# Patient Record
Sex: Female | Born: 1945 | Race: White | Hispanic: No | Marital: Married | Smoking: Never smoker
Health system: Southern US, Community
[De-identification: ages and names within clinical notes are randomized; demographics above are authoritative.]

## PROBLEM LIST (undated history)

## (undated) DIAGNOSIS — E279 Disorder of adrenal gland, unspecified: Secondary | ICD-10-CM

## (undated) DIAGNOSIS — R030 Elevated blood-pressure reading, without diagnosis of hypertension: Secondary | ICD-10-CM

## (undated) DIAGNOSIS — I998 Other disorder of circulatory system: Secondary | ICD-10-CM

## (undated) DIAGNOSIS — R3121 Asymptomatic microscopic hematuria: Secondary | ICD-10-CM

## (undated) HISTORY — DX: Asymptomatic microscopic hematuria: R31.21

## (undated) HISTORY — DX: Other disorder of circulatory system: I99.8

## (undated) HISTORY — DX: Elevated blood-pressure reading, without diagnosis of hypertension: R03.0

## (undated) HISTORY — DX: Disorder of adrenal gland, unspecified: E27.9

---

## 1980-08-28 HISTORY — PX: APPENDECTOMY: SHX54

## 2009-05-28 HISTORY — PX: NISSEN FUNDOPLICATION: SHX2091

## 2018-09-04 ENCOUNTER — Other Ambulatory Visit: Payer: Self-pay | Admitting: Internal Medicine

## 2018-09-04 DIAGNOSIS — Z1231 Encounter for screening mammogram for malignant neoplasm of breast: Secondary | ICD-10-CM

## 2018-09-04 DIAGNOSIS — R030 Elevated blood-pressure reading, without diagnosis of hypertension: Secondary | ICD-10-CM

## 2018-09-04 DIAGNOSIS — I998 Other disorder of circulatory system: Secondary | ICD-10-CM

## 2018-09-04 HISTORY — DX: Elevated blood-pressure reading, without diagnosis of hypertension: R03.0

## 2018-09-04 HISTORY — DX: Other disorder of circulatory system: I99.8

## 2018-10-24 ENCOUNTER — Other Ambulatory Visit (HOSPITAL_COMMUNITY): Payer: Self-pay | Admitting: Internal Medicine

## 2018-10-24 ENCOUNTER — Other Ambulatory Visit: Payer: Self-pay | Admitting: Internal Medicine

## 2018-10-24 DIAGNOSIS — R3121 Asymptomatic microscopic hematuria: Secondary | ICD-10-CM

## 2018-10-30 ENCOUNTER — Ambulatory Visit
Admission: RE | Admit: 2018-10-30 | Discharge: 2018-10-30 | Disposition: A | Discharge status: Home / Self Care | Payer: Medicare Other | Source: Ambulatory Visit | Attending: Internal Medicine | Admitting: Internal Medicine

## 2018-10-30 ENCOUNTER — Other Ambulatory Visit: Payer: Self-pay

## 2018-10-30 DIAGNOSIS — R3121 Asymptomatic microscopic hematuria: Secondary | ICD-10-CM | POA: Insufficient documentation

## 2018-10-30 HISTORY — DX: Asymptomatic microscopic hematuria: R31.21

## 2018-10-30 LAB — POCT I-STAT CREATININE: Creatinine, Ser: 0.7 mg/dL (ref 0.44–1.00)

## 2018-10-30 MED ORDER — IOHEXOL 300 MG/ML  SOLN
125.0000 mL | Freq: Once | INTRAMUSCULAR | Status: AC | PRN
  Administered 2018-10-30: 125 mL via INTRAVENOUS

## 2018-11-18 ENCOUNTER — Ambulatory Visit: Payer: Self-pay | Admitting: Urology

## 2018-11-18 ENCOUNTER — Encounter

## 2018-11-21 ENCOUNTER — Telehealth: Payer: Self-pay | Admitting: Urology

## 2018-11-21 NOTE — Telephone Encounter (Signed)
-----   Message from Abbie Sons, MD sent at 11/21/2018  7:53 AM EDT ----- Regarding: Appointment Patient is scheduled to see me on 4/20.  She has an obstructing stone with hydronephrosis.  Please reschedule to 1 of my Tuesdays within the next 1 to 2 weeks.

## 2018-11-21 NOTE — Telephone Encounter (Signed)
LM FOR PT TO CB TO CONFIRM

## 2018-11-26 ENCOUNTER — Ambulatory Visit: Payer: Self-pay | Admitting: Urology

## 2018-11-28 DIAGNOSIS — E279 Disorder of adrenal gland, unspecified: Secondary | ICD-10-CM

## 2018-11-28 DIAGNOSIS — E278 Other specified disorders of adrenal gland: Secondary | ICD-10-CM

## 2018-11-28 HISTORY — DX: Other specified disorders of adrenal gland: E27.8

## 2018-12-03 ENCOUNTER — Ambulatory Visit: Payer: Self-pay | Admitting: Urology

## 2018-12-11 ENCOUNTER — Other Ambulatory Visit: Payer: Self-pay

## 2018-12-11 ENCOUNTER — Ambulatory Visit: Payer: Medicare Other | Admitting: Urology

## 2018-12-11 ENCOUNTER — Encounter: Payer: Self-pay | Admitting: Urology

## 2018-12-11 VITALS — Ht 64.0 in | Wt 176.0 lb

## 2018-12-11 DIAGNOSIS — N200 Calculus of kidney: Secondary | ICD-10-CM

## 2018-12-11 DIAGNOSIS — D179 Benign lipomatous neoplasm, unspecified: Secondary | ICD-10-CM | POA: Diagnosis not present

## 2018-12-11 DIAGNOSIS — D3502 Benign neoplasm of left adrenal gland: Secondary | ICD-10-CM | POA: Diagnosis not present

## 2018-12-11 LAB — URINALYSIS, COMPLETE
Bilirubin, UA: NEGATIVE
Glucose, UA: NEGATIVE
Ketones, UA: NEGATIVE
Leukocytes,UA: NEGATIVE
Nitrite, UA: NEGATIVE
Protein,UA: NEGATIVE
Specific Gravity, UA: >1.03 — ABNORMAL HIGH (ref 1.005–1.030)
Urobilinogen, Ur: 0.2 mg/dL (ref 0.2–1.0)
pH, UA: 5.5 (ref 5.0–7.5)

## 2018-12-11 LAB — MICROSCOPIC EXAMINATION
Bacteria, UA: NONE SEEN
RBC, Urine: NONE SEEN /hpf (ref 0–2)
WBC, UA: NONE SEEN /hpf (ref 0–5)

## 2018-12-11 NOTE — Patient Instructions (Signed)
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

## 2018-12-11 NOTE — Progress Notes (Signed)
12/11/2018 5:06 PM   Jasmin Walker 01/10/1946 [id number]  Referring provider: Glendon Axe, MD [facility] [employer] [place], [place] [address]  Chief Complaint  Patient presents with  . Nephrolithiasis    New patient    HPI: 73 year old female referred for further evaluation of kidney stones and angiomyolipoma.  She started experiencing right flank pain along with urinary symptoms including urgency, frequency, sensation of difficulty urinating along with microscopic hematuria.  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  She saw stone pass 2 weeks ago ago which she was unablet o pass.  She describes it as a barbell shaped.  She drew out the size and shape of the stone which she estimated which is consistent with the UVJ stone on CT scan.  She last had pain 1.5 weeks ago which was more dull.  Subsequently, her urinary symptoms have started to completely resolve and she has no further pain or discomfort.  She has had never previously had stones.    Discharge drink a lot of water including lemon.  She avoids salt and primarily eats at home, non-processed foods.  She has been referred to endocrinology for further evaluation of the incidental adrenal adenoma.  PMH: Past Medical History:  Diagnosis Date  . Adrenal nodule ([place]) 11/28/2018  . Asymptomatic microscopic hematuria 10/30/2018  . Fluctuating blood pressure 09/04/2018  . White coat syndrome without diagnosis of hypertension 09/04/2018    Surgical History: Past Surgical History:  Procedure Laterality Date  . APPENDECTOMY  1982  . NISSEN FUNDOPLICATION  16/0109    Home Medications:  Allergies as of 12/11/2018   No Known Allergies     Medication List       Accurate as of December 11, 2018   5:06 PM. Always use your most recent med list.        omeprazole 10 MG capsule Commonly known as:  PRILOSEC Take by mouth.   tamsulosin 0.4 MG Caps capsule Commonly known as:  FLOMAX Take by mouth.       Allergies: No Known Allergies  Family History: Family History  Problem Relation Age of Onset  . Cancer Maternal Grandmother        Stomach    Social History:  reports that she has never smoked. She has never used smokeless tobacco. She reports current alcohol use. She reports that she does not use drugs.  ROS: UROLOGY Frequent Urination?: No Hard to postpone urination?: No Burning/pain with urination?: No Get up at night to urinate?: Yes Leakage of urine?: No Urine stream starts and stops?: Yes Trouble starting stream?: No Do you have to strain to urinate?: No Blood in urine?: No Urinary tract infection?: No Sexually transmitted disease?: No Injury to kidneys or bladder?: No Painful intercourse?: No Weak stream?: No Currently pregnant?: No Vaginal bleeding?: No Last menstrual period?: n  Gastrointestinal Nausea?: No Vomiting?: No Indigestion/heartburn?: No Diarrhea?: No Constipation?: No  Constitutional Fever: No Night sweats?: No Weight loss?: No Fatigue?: No  Skin Skin rash/lesions?: No Itching?: No  Eyes Blurred vision?: No Double vision?: No  Ears/Nose/Throat Sore throat?: No Sinus problems?: No  Hematologic/Lymphatic Swollen glands?: No Easy bruising?: No  Cardiovascular Leg swelling?: No Chest pain?: No  Respiratory Cough?: No Shortness of breath?: No  Endocrine Excessive thirst?: No  Musculoskeletal Back  pain?: No Joint pain?: No  Neurological Headaches?: No Dizziness?: No  Psychologic Depression?: No Anxiety?: No  Physical Exam: Ht 5\' 4"  (1.626 m)   Wt 176 lb (79.8 kg)   BMI 30.21 kg/m   Constitutional:  Alert and oriented, No acute distress. HEENT: [place] AT, moist mucus membranes.  Trachea midline, no  masses. Cardiovascular: No clubbing, cyanosis, or edema. Respiratory: Normal respiratory effort, no increased work of breathing. Skin: No rashes, bruises or suspicious lesions. Neurologic: Grossly intact, no focal deficits, moving all 4 extremities. Psychiatric: Normal mood and affect.  Laboratory Data: Lab Results  Component Value Date   CREATININE 0.70 10/30/2018    Urinalysis Results for orders placed or performed in visit on 12/11/18  Microscopic Examination  Result Value Ref Range   WBC, UA None seen 0 - 5 /hpf   RBC None seen 0 - 2 /hpf   Epithelial Cells (non renal) 0-10 0 - 10 /hpf   Bacteria, UA None seen None seen/Few  Urinalysis, Complete  Result Value Ref Range   Specific Gravity, UA >1.030 (H) 1.005 - 1.030   pH, UA 5.5 5.0 - 7.5   Color, UA Yellow Yellow   Appearance Ur Cloudy (A) Clear   Leukocytes,UA Negative Negative   Protein,UA Negative Negative/Trace   Glucose, UA Negative Negative   Ketones, UA Negative Negative   RBC, UA Trace (A) Negative   Bilirubin, UA Negative Negative   Urobilinogen, Ur 0.2 0.2 - 1.0 mg/dL   Nitrite, UA Negative Negative   Microscopic Examination See below:     Pertinent Imaging: CLINICAL DATA:  Right flank pain over the last 2 weeks with polyuria. Microscopic hematuria.  EXAM: CT ABDOMEN AND PELVIS WITHOUT AND WITH CONTRAST  TECHNIQUE: Multidetector CT imaging of the abdomen and pelvis was performed following the standard protocol before and following the bolus administration of intravenous contrast.  CONTRAST:  161mL OMNIPAQUE IOHEXOL 300 MG/ML  SOLN  COMPARISON:  None.  FINDINGS: Lower chest: A left lower lobe subpleural nodule measures 1.3 by 1.2 by 1.4 cm.  Left anterior descending and circumflex coronary artery atherosclerotic calcification. Descending thoracic aortic atherosclerosis.  Hiatal hernia noted containing a partially un wrapped fundoplication.  Hepatobiliary: 1.2 by 1.0 cm hypodense  lesion in the caudate lobe is likely a cyst. Gallbladder unremarkable.  Pancreas: Benign-appearing fatty lesion junction of the pancreatic body and tail measuring 1.2 by 0.8 by 0.9 cm, favoring a lipoma.  Spleen: Unremarkable  Adrenals/Urinary Tract: 2.4 by 1.9 cm left adrenal mass noted with absolute washout of 56% and a relative washout of 46%, consistent with adenoma. Slight nodularity of the right adrenal gland without discrete right adrenal mass.  2.9 by 2.3 by 2.5 cm primarily fatty mass of the right kidney upper pole compatible with angiomyolipoma, with small internal enhancing elements.  1.6 cm nonenhancing cystic lesion of the right mid kidney on image 38/9, compatible with benign and likely simple cyst.  2 hypodense lesions of the left kidney upper pole is technically nonspecific due to small size.  There is mild right hydronephrosis as well as mild right hydroureter extending down to a right UVJ calculus or cluster of calculi measuring 0.9 by 0.4 by 0.4 cm. Accentuated right ureteral enhancement and mild wall thickening of the bladder in the immediate vicinity of the right UVJ, probably due to local inflammation.  Suspected punctate 1 mm right kidney lower pole nonobstructive renal calculus. There are about 7 punctate nonobstructive left renal calculi, the largest 3 mm in long  axis in the left mid kidney on image 72/5. No left hydronephrosis or left hydroureter.  Stomach/Bowel: As noted above cysts, a partially unwrapped fundoplication extends up into a small hiatal hernia.  Vascular/Lymphatic: Aortoiliac atherosclerotic vascular disease.  Reproductive: Unremarkable  Other: No supplemental non-categorized findings.  Musculoskeletal: Suspected small bilateral indirect inguinal hernias containing adipose tissue. Lower thoracic spondylosis. Mild lumbar degenerative disc disease. Right foraminal impingement at L5-S1 due to facet arthropathy and right  foraminal disc protrusion.  IMPRESSION: 1. Cluster of right UVJ calculi measuring 0.9 by 0.4 by 0.4 cm, with adjacent wall thickening of the distal right ureter and of the urinary bladder in the immediate vicinity of the UVJ likely due to local inflammation. Associated mild right hydronephrosis and hydroureter. 2. Bilateral additional nonobstructive renal calculi. 3. 2.4 cm left adrenal adenoma. 4. 2.9 cm right kidney upper pole angiomyolipoma. 5. Partially un wrapped fundoplication extends up into a small hiatal hernia. 6.  Aortic Atherosclerosis (ICD10-I70.0).  Coronary atherosclerosis. 7. Right foraminal impingement at L5-S1. 8. Small pancreatic lipoma.   Electronically Signed   By: Van Clines M.D.   On: 10/30/2018 16:46   Assessment & Plan:    1. Kidney stones Left UVJ stone, subsequently passed Given the patient's description of the stone which matches the CT scan exactly and her resolution of pain and clear urine today, I am certain that she has passed the stone She does have a punctate stone, nonobstructing on the left but otherwise a stone free For further imaging or intervention at this time We discussed general stone prevention techniques including drinking plenty water with goal of producing 2.5 L urine daily, increased citric acid intake, avoidance of high oxalate containing foods, and decreased salt intake.  Information about dietary recommendations given today.   - Urinalysis, Complete  2. 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 Adenoma of left adrenal gland Being evaluated by endocrinology  Return in about 2  years (around 12/10/2020) for RUS.  Hollice Espy, MD  [facility] [address], [place], [place] [address] [phone]

## 2018-12-16 ENCOUNTER — Ambulatory Visit: Payer: Self-pay | Admitting: Urology

## 2018-12-17 ENCOUNTER — Ambulatory Visit: Payer: Self-pay | Admitting: Urology

## 2019-01-29 ENCOUNTER — Ambulatory Visit: Payer: Self-pay | Admitting: Urology

## 2019-05-21 ENCOUNTER — Other Ambulatory Visit: Payer: Self-pay | Admitting: Physician Assistant

## 2019-05-21 DIAGNOSIS — R1032 Left lower quadrant pain: Secondary | ICD-10-CM

## 2019-05-22 ENCOUNTER — Encounter (INDEPENDENT_AMBULATORY_CARE_PROVIDER_SITE_OTHER): Payer: Self-pay

## 2019-05-22 ENCOUNTER — Other Ambulatory Visit: Payer: Self-pay

## 2019-05-22 ENCOUNTER — Ambulatory Visit
Admission: RE | Admit: 2019-05-22 | Discharge: 2019-05-22 | Disposition: A | Discharge status: Home / Self Care | Payer: Medicare Other | Source: Ambulatory Visit | Attending: Physician Assistant | Admitting: Physician Assistant

## 2019-05-22 DIAGNOSIS — R1032 Left lower quadrant pain: Secondary | ICD-10-CM | POA: Diagnosis present

## 2019-05-22 MED ORDER — IOHEXOL 300 MG/ML  SOLN
100.0000 mL | Freq: Once | INTRAMUSCULAR | Status: AC | PRN
  Administered 2019-05-22: 100 mL via INTRAVENOUS

## 2019-05-26 NOTE — Progress Notes (Signed)
05/27/2019 3:36 PM   Darriel Schall [place] 08/12/1946 [id number]  Referring provider: Glendon Axe, MD No address on file  Chief Complaint  Patient presents with  . Other    HPI: 73 year old female referred again for further evaluation of kidney stones.    CT abdomen pelvis with and without contrast on 10/30/2018 which showed a 9 mm presumed cluster of right UVJ stones with proximal hydroureteronephrosis along with a incidental adrenal adenoma and 2.9 cm right upper pole angiomyolipoma.  Endocrinology following incidental adrenal adenoma.    Spontaneously passed the right UVJ stones.  Recent CT abdomen pelvis with contrast on 05/22/2019 revealed moderate left hydronephrosis due to a punctate stone in the left ureter at approximately the level of L4.  Mild fullness of the right intrarenal collecting system is improved compared to the prior CT. Cluster of stones at the right UVJ seen on the prior study is no longer present.  Single punctate nonobstructing renal stones.  2.6 cm angiomyolipoma in the upper pole the right kidney, unchanged.  Unchanged left adrenal adenoma.  She states that she had the sudden onset of left flank pain that radiated to her left waist about two weeks ago.  When the pain first occurred, it caused severe nausea.  It has been of less intensity over the last two weeks.  It is colicky in nature and similar to the other stones she has had in the past.  She has not seen passage of a fragment or had gross hematuria.  Her UA today is negative.  She has been asymptomatic over the last two weeks.    PMH: Past Medical History:  Diagnosis Date  . Adrenal nodule ([place]) 11/28/2018  . Asymptomatic microscopic hematuria 10/30/2018  . Fluctuating blood pressure 09/04/2018  . White coat syndrome without diagnosis of hypertension 09/04/2018    Surgical History: Past Surgical History:  Procedure Laterality Date  . APPENDECTOMY  1982  . NISSEN FUNDOPLICATION  [id number]    Home  Medications:  Allergies as of 05/27/2019   No Known Allergies     Medication List       Accurate as of May 27, 2019  3:36 PM. If you have any questions, ask your nurse or doctor.        omeprazole 10 MG capsule Commonly known as: PRILOSEC Take by mouth.   tamsulosin 0.4 MG Caps capsule Commonly known as: FLOMAX Take by mouth.       Allergies: No Known Allergies  Family History: Family History  Problem Relation Age of Onset  . Cancer Maternal Grandmother        Stomach    Social History:  reports that she has never smoked. She has never used smokeless tobacco. She reports current alcohol use. She reports that she does not use drugs.  ROS: UROLOGY Frequent Urination?: No Hard to postpone urination?: Yes Burning/pain with urination?: No Get up at night to urinate?: No Leakage of urine?: No Urine stream starts and stops?: Yes Trouble starting stream?: No Do you have to strain to urinate?: No Blood in urine?: No Urinary tract infection?: No Sexually transmitted disease?: No Injury to kidneys or bladder?: No Painful intercourse?: No Weak stream?: No Currently pregnant?: No Vaginal bleeding?: No Last menstrual period?: n  Gastrointestinal Nausea?: No Vomiting?: No Indigestion/heartburn?: No Diarrhea?: No Constipation?: No  Constitutional Fever: No Night sweats?: No Weight loss?: No Fatigue?: No  Skin Skin rash/lesions?: No Itching?: No  Eyes Blurred vision?: No Double vision?: No  Ears/Nose/Throat Sore throat?:  No Sinus problems?: No  Hematologic/Lymphatic Swollen glands?: No Easy bruising?: No  Cardiovascular Leg swelling?: No Chest pain?: No  Respiratory Cough?: No Shortness of breath?: No  Endocrine Excessive thirst?: No  Musculoskeletal Back pain?: No Joint pain?: No  Neurological Headaches?: No Dizziness?: No  Psychologic Depression?: No Anxiety?: No  Physical Exam: BP (!) 185/96   Pulse 91   Ht 5\' 1"   (1.549 m)   Wt 173 lb (78.5 kg)   BMI 32.69 kg/m   Constitutional:  Alert and oriented, No acute distress. HEENT: [place] AT, moist mucus membranes.  Trachea midline, no masses. Cardiovascular: No clubbing, cyanosis, or edema. Respiratory: Normal respiratory effort, no increased work of breathing. Skin: No rashes, bruises or suspicious lesions. Neurologic: Grossly intact, no focal deficits, moving all 4 extremities. Psychiatric: Normal mood and affect.  Laboratory Data: Lab Results  Component Value Date   CREATININE 0.70 10/30/2018    Urinalysis Results for orders placed or performed in visit on 12/11/18  Microscopic Examination   URINE  Result Value Ref Range   WBC, UA None seen 0 - 5 /hpf   RBC None seen 0 - 2 /hpf   Epithelial Cells (non renal) 0-10 0 - 10 /hpf   Bacteria, UA None seen None seen/Few  Urinalysis, Complete  Result Value Ref Range   Specific Gravity, UA >1.030 (H) 1.005 - 1.030   pH, UA 5.5 5.0 - 7.5   Color, UA Yellow Yellow   Appearance Ur Cloudy (A) Clear   Leukocytes,UA Negative Negative   Protein,UA Negative Negative/Trace   Glucose, UA Negative Negative   Ketones, UA Negative Negative   RBC, UA Trace (A) Negative   Bilirubin, UA Negative Negative   Urobilinogen, Ur 0.2 0.2 - 1.0 mg/dL   Nitrite, UA Negative Negative   Microscopic Examination See below:    Component     Latest Ref Rng & Units 05/27/2019  Specific Gravity, UA     1.005 - 1.030 1.020  pH, UA     5.0 - 7.5 7.0  Color, UA     Yellow Yellow  Appearance Ur     Clear Clear  Leukocytes,UA     Negative Negative  Protein,UA     Negative/Trace Negative  Glucose, UA     Negative Negative  Ketones, UA     Negative Negative  RBC, UA     Negative Trace (A)  Bilirubin, UA     Negative Negative  Urobilinogen, Ur     0.2 - 1.0 mg/dL 0.2  Nitrite, UA     Negative Negative  Microscopic Examination      See below:   Component     Latest Ref Rng & Units 05/27/2019  WBC, UA     0 - 5  /hpf 0-5  RBC     0 - 2 /hpf 0-2  Epithelial Cells (non renal)     0 - 10 /hpf 0-10  Bacteria, UA     None seen/Few None seen    Pertinent Imaging: CLINICAL DATA:  Decreased bowel movements and left abdominal pain beginning 05/13/2019.  EXAM: CT ABDOMEN AND PELVIS WITH CONTRAST  TECHNIQUE: Multidetector CT imaging of the abdomen and pelvis was performed using the standard protocol following bolus administration of intravenous contrast.  CONTRAST:  100 mL OMNIPAQUE IOHEXOL 300 MG/ML  SOLN  COMPARISON:  CT abdomen and pelvis 10/30/2018.  FINDINGS: Lower chest: Lung bases are clear. No pleural or pericardial effusion.  Hepatobiliary: Small cyst in the right  hepatic lobe is unchanged. The liver is otherwise normal in appearance. Gallbladder and biliary tree appear normal.  Pancreas: Small lipoma in the body of the pancreas is unchanged. The pancreas is otherwise normal in appearance.  Spleen: Normal in size without focal abnormality.  Adrenals/Urinary Tract: Left adrenal adenoma is again seen. Right adrenal gland appears normal. 2.6 cm diameter angiomyolipoma in the upper pole of the right kidney is also unchanged. There is a small right renal cyst.  Punctate nonobstructing stone midpole left kidney and lower pole of the right kidney are identified. There is new moderate left hydronephrosis due to a punctate stone in the left ureter at the level of L4. Mild right hydronephrosis seen on the prior CT scan has improved. Cluster of stones at the right UVJ on the prior CT is no longer identified. Urinary bladder is unremarkable.  Stomach/Bowel: The appendix is not visualized but no evidence of appendicitis is seen. The patient is status post Nissen fundoplication with a small hiatal hernia. Small and large bowel are unremarkable.  Vascular/Lymphatic: Aortic atherosclerosis. No enlarged abdominal or pelvic lymph nodes.  Reproductive: Uterus and bilateral  adnexa are unremarkable.  Other: Fat containing inguinal hernias are unchanged.  Musculoskeletal: No acute or focal abnormality.  IMPRESSION: Moderate left hydronephrosis due to a punctate stone in the left ureter at approximately the level of L4.  Mild fullness of the right intrarenal collecting system is improved compared to the prior CT. Cluster of stones at the right UVJ seen on the prior study is no longer present.  Single punctate nonobstructing renal stones.  2.6 cm angiomyolipoma in the upper pole the right kidney, unchanged.  Unchanged left adrenal adenoma.   Electronically Signed   By: Inge Rise M.D.   On: 05/22/2019 14:34 I have independently reviewed the films with the patient and demonstrated the obstructing stone.    Assessment & Plan:    1. Kidney stones Left mid ureteral stone, likely passed  - Urinalysis, Complete Will obtain a RUS to ensure resolution of hydronephrosis I will call her with results   2. 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 Adenoma of left adrenal gland Followed by endocrinology   Return for I will call patient with results.  Zara Council, PA-C  [facility] [address] [place], [place] [address] [phone]

## 2019-05-27 ENCOUNTER — Ambulatory Visit: Payer: Medicare Other | Admitting: Urology

## 2019-05-27 ENCOUNTER — Encounter: Payer: Self-pay | Admitting: Urology

## 2019-05-27 ENCOUNTER — Other Ambulatory Visit: Payer: Self-pay

## 2019-05-27 VITALS — BP 185/96 | HR 91 | Ht 61.0 in | Wt 173.0 lb

## 2019-05-27 DIAGNOSIS — D179 Benign lipomatous neoplasm, unspecified: Secondary | ICD-10-CM | POA: Diagnosis not present

## 2019-05-27 DIAGNOSIS — D3502 Benign neoplasm of left adrenal gland: Secondary | ICD-10-CM | POA: Diagnosis not present

## 2019-05-27 DIAGNOSIS — N200 Calculus of kidney: Secondary | ICD-10-CM

## 2019-05-27 LAB — URINALYSIS, COMPLETE
Bilirubin, UA: NEGATIVE
Glucose, UA: NEGATIVE
Ketones, UA: NEGATIVE
Leukocytes,UA: NEGATIVE
Nitrite, UA: NEGATIVE
Protein,UA: NEGATIVE
Specific Gravity, UA: 1.02 (ref 1.005–1.030)
Urobilinogen, Ur: 0.2 mg/dL (ref 0.2–1.0)
pH, UA: 7 (ref 5.0–7.5)

## 2019-05-27 LAB — MICROSCOPIC EXAMINATION: Bacteria, UA: NONE SEEN

## 2019-05-30 LAB — CULTURE, URINE COMPREHENSIVE

## 2019-06-19 ENCOUNTER — Ambulatory Visit
Admission: RE | Admit: 2019-06-19 | Discharge: 2019-06-19 | Disposition: A | Discharge status: Home / Self Care | Payer: Medicare Other | Source: Ambulatory Visit | Attending: Urology | Admitting: Urology

## 2019-06-19 ENCOUNTER — Other Ambulatory Visit: Payer: Self-pay

## 2019-06-19 DIAGNOSIS — N200 Calculus of kidney: Secondary | ICD-10-CM

## 2019-06-20 ENCOUNTER — Other Ambulatory Visit: Payer: Self-pay | Admitting: Urology

## 2019-06-20 DIAGNOSIS — N1330 Unspecified hydronephrosis: Secondary | ICD-10-CM

## 2019-06-30 ENCOUNTER — Other Ambulatory Visit: Payer: Self-pay | Admitting: Urology

## 2019-06-30 DIAGNOSIS — N1330 Unspecified hydronephrosis: Secondary | ICD-10-CM

## 2019-07-21 ENCOUNTER — Ambulatory Visit
Admission: RE | Admit: 2019-07-21 | Discharge: 2019-07-21 | Disposition: A | Discharge status: Home / Self Care | Payer: Medicare Other | Source: Ambulatory Visit | Attending: Urology | Admitting: Urology

## 2019-07-21 ENCOUNTER — Other Ambulatory Visit: Payer: Self-pay

## 2019-07-21 DIAGNOSIS — N1330 Unspecified hydronephrosis: Secondary | ICD-10-CM | POA: Diagnosis present

## 2019-07-22 ENCOUNTER — Other Ambulatory Visit: Payer: Self-pay | Admitting: Urology

## 2019-07-22 DIAGNOSIS — N1330 Unspecified hydronephrosis: Secondary | ICD-10-CM

## 2019-08-12 ENCOUNTER — Other Ambulatory Visit: Payer: Self-pay

## 2019-08-12 ENCOUNTER — Ambulatory Visit
Admission: RE | Admit: 2019-08-12 | Discharge: 2019-08-12 | Disposition: A | Discharge status: Home / Self Care | Payer: Medicare Other | Source: Ambulatory Visit | Attending: Urology | Admitting: Urology

## 2019-08-12 DIAGNOSIS — N1330 Unspecified hydronephrosis: Secondary | ICD-10-CM | POA: Insufficient documentation

## 2019-08-12 MED ORDER — IOHEXOL 300 MG/ML  SOLN
125.0000 mL | Freq: Once | INTRAMUSCULAR | Status: AC | PRN
  Administered 2019-08-12: 125 mL via INTRAVENOUS

## 2019-08-30 ENCOUNTER — Other Ambulatory Visit: Payer: Self-pay

## 2019-08-30 ENCOUNTER — Encounter: Payer: Self-pay | Admitting: Emergency Medicine

## 2019-08-30 ENCOUNTER — Emergency Department
Admission: EM | Admit: 2019-08-30 | Discharge: 2019-08-30 | Disposition: A | Discharge status: Home / Self Care | Payer: Medicare Other | Attending: Emergency Medicine | Admitting: Emergency Medicine

## 2019-08-30 ENCOUNTER — Emergency Department: Payer: Medicare Other

## 2019-08-30 DIAGNOSIS — Z20822 Contact with and (suspected) exposure to covid-19: Secondary | ICD-10-CM | POA: Diagnosis not present

## 2019-08-30 DIAGNOSIS — Z79899 Other long term (current) drug therapy: Secondary | ICD-10-CM | POA: Diagnosis not present

## 2019-08-30 DIAGNOSIS — I10 Essential (primary) hypertension: Secondary | ICD-10-CM | POA: Diagnosis not present

## 2019-08-30 DIAGNOSIS — R42 Dizziness and giddiness: Secondary | ICD-10-CM | POA: Diagnosis not present

## 2019-08-30 LAB — BASIC METABOLIC PANEL
Anion gap: 13 (ref 5–15)
BUN: 17 mg/dL (ref 8–23)
CO2: 24 mmol/L (ref 22–32)
Calcium: 9.5 mg/dL (ref 8.9–10.3)
Chloride: 100 mmol/L (ref 98–111)
Creatinine, Ser: 0.82 mg/dL (ref 0.44–1.00)
GFR calc Af Amer: >60 mL/min (ref >60)
GFR calc non Af Amer: >60 mL/min (ref >60)
Glucose, Bld: 105 mg/dL — ABNORMAL HIGH (ref 70–99)
Potassium: 3.9 mmol/L (ref 3.5–5.1)
Sodium: 137 mmol/L (ref 135–145)

## 2019-08-30 LAB — CBC
HCT: 44.9 % (ref 36.0–46.0)
Hemoglobin: 15 g/dL (ref 12.0–15.0)
MCH: 30.7 pg (ref 26.0–34.0)
MCHC: 33.4 g/dL (ref 30.0–36.0)
MCV: 91.8 fL (ref 80.0–100.0)
Platelets: 182 10*3/uL (ref 150–400)
RBC: 4.89 MIL/uL (ref 3.87–5.11)
RDW: 12.5 % (ref 11.5–15.5)
WBC: 5.1 10*3/uL (ref 4.0–10.5)
nRBC: 0 % (ref 0.0–0.2)

## 2019-08-30 LAB — TROPONIN I (HIGH SENSITIVITY)
Troponin I (High Sensitivity): 6 ng/L (ref <18)
Troponin I (High Sensitivity): 7 ng/L (ref <18)

## 2019-08-30 MED ORDER — HYDROCHLOROTHIAZIDE 12.5 MG PO TABS: 12.5000 mg | ORAL_TABLET | Freq: Every day | ORAL | 0 refills | Status: AC

## 2019-08-30 MED ORDER — ACETAMINOPHEN 500 MG PO TABS
1000.0000 mg | ORAL_TABLET | ORAL | Status: AC
  Administered 2019-08-30: 1000 mg via ORAL
  Filled 2019-08-30: qty 2

## 2019-08-30 MED ORDER — SODIUM CHLORIDE 0.9% FLUSH: 3.0000 mL | Freq: Once | INTRAVENOUS | Status: DC

## 2019-08-30 NOTE — ED Triage Notes (Signed)
States noted blood pressure elevated a few times this am. Max 215/125. States got concerned. Lips started feeling tingly. Had headache. No neuro deficits. Came here for eval. Alert, oriented conversant in triage.

## 2019-08-30 NOTE — Discharge Instructions (Addendum)
Additionally, we have sent a coronavirus test that will result in about 3 to 4 days.  Until your test is negative, please isolate from others as best possible, minimize any exposure to the general public in case you are positive.

## 2019-08-30 NOTE — ED Provider Notes (Signed)
[facility] Emergency Department Provider Note  ___________________________________________   First MD Initiated Contact with Patient 08/30/19 1137     (approximate)  I have reviewed the triage vital signs and the nursing notes.   HISTORY  Chief Complaint Hypertension   HPI Jasmin Walker is a 74 y.o. female for evaluation of feeling little bit lightheaded with mild headache for about 4 days.  She reports that she checked her blood pressure today at home and it was high over 220.  She does however also tell me that she has severe whitecoat hypertension and that she will have blood pressures over 200 when she is at her doctor's office but when she is home will sometimes be about 120 so they have never started on her medication but have discussed potentially starting some primary in the past  She is not had a recent cough fevers or chills.  No known Covid exposure.  Recently came back from a vacation to the beach.  No chest pain no trouble breathing.  No irregular heartbeats.  She does report that she can feel when her blood pressure gets high, and I did feel that way earlier it seems to be getting better.  She is not having trouble walking.  No change in speech.  No severe headache just reports a mild feeling of headache.  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 Authorizing Provider  hydrochlorothiazide (HYDRODIURIL) 12.5 MG tablet Take 1 tablet (12.5 mg total) by mouth daily. 08/30/19   Delman Kitten, MD  omeprazole (PRILOSEC) 10 MG capsule Take by mouth.    [provider]  tamsulosin (FLOMAX) 0.4 MG CAPS capsule Take by mouth. 11/27/18 11/27/19  [provider]    Allergies Patient has no known allergies.  Family History  Problem Relation Age of Onset   Cancer Maternal Grandmother        Stomach    Social History Social History   Tobacco Use   Smoking status: Never Smoker   Smokeless tobacco: Never Used  Substance Use Topics   Alcohol use: Yes   Drug use: Never    Review of Systems Constitutional: No fever/chills Eyes: No visual changes. ENT: No sore throat. Cardiovascular: Denies chest pain. Respiratory: Denies shortness of breath. Gastrointestinal: No abdominal pain.   Genitourinary: Negative for dysuria. Musculoskeletal: Negative for back pain. Skin: Negative for rash. Neurological: Negative for areas of focal weakness or numbness.  She did feel like she had tingling around the lips on both side of the face earlier today when her blood pressure was very high that has resolved    ____________________________________________   PHYSICAL EXAM:  VITAL SIGNS: ED Triage Vitals  Enc Vitals Group     BP 08/30/19 0758 (!) 192/102     Pulse Rate 08/30/19 0758 (!) 104     Resp 08/30/19  0758 20     Temp 08/30/19 0758 98.1 F (36.7 C)     Temp Source 08/30/19 0758 Oral     SpO2 08/30/19 0758 98 %     Weight 08/30/19 0759 170 lb (77.1 kg)     Height 08/30/19 0759 5' (1.524 m)     Head Circumference --      Peak Flow --      Pain Score 08/30/19 0759 4     Pain Loc --      Pain Edu? --      Excl. in [place]? --     Constitutional: Alert and oriented. Well appearing and in no acute distress. Eyes: Conjunctivae are normal. Head: Atraumatic. Nose: No  congestion/rhinnorhea. Mouth/Throat: Mucous membranes are moist. Neck: No stridor.  Cardiovascular: Normal rate, regular rhythm. Grossly normal heart sounds.  Good peripheral circulation. Respiratory: Normal respiratory effort.  No retractions. Lungs CTAB. Gastrointestinal: Soft and nontender. No distention. Musculoskeletal: No lower extremity tenderness nor edema. Neurologic:  Normal speech and language. No gross focal neurologic deficits are appreciated.  No ataxia.  Normal extraocular movements.  5 out of 5 strength in all extremities.  No pronator drift.  Clear normal speech.  Normal cranial nerve exam Skin:  Skin is warm, dry and intact. No rash noted. Psychiatric: Mood and affect are normal. Speech and behavior are normal.  ____________________________________________   LABS (all labs ordered are listed, but only abnormal results are displayed)  Labs Reviewed  BASIC METABOLIC PANEL - Abnormal; Notable for the following components:      Result Value   Glucose, Bld 105 (*)    All other components within normal limits  NOVEL CORONAVIRUS, NAA (HOSP ORDER, SEND-OUT TO REF LAB; TAT 18-24 HRS)  CBC  TROPONIN I (HIGH SENSITIVITY)  TROPONIN I (HIGH SENSITIVITY)   RADIOLOGY  DG Chest 2 View  Result Date: 08/30/2019 CLINICAL DATA:  Hypertension. EXAM: CHEST - 2 VIEW COMPARISON:  None. FINDINGS: The heart size and mediastinal contours are within normal limits. Both lungs are clear. The visualized skeletal structures are unremarkable. IMPRESSION: No active cardiopulmonary disease. Electronically Signed   By: Marijo Conception M.D.   On: 08/30/2019 09:18   CT Head Wo Contrast  Result Date: 08/30/2019 CLINICAL DATA:  Headache. EXAM: CT HEAD WITHOUT CONTRAST TECHNIQUE: Contiguous axial images were obtained from the base of the skull through the vertex without intravenous contrast. COMPARISON:  None. FINDINGS: Brain: Mild chronic ischemic white matter disease is noted. 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 No acute intracranial abnormality seen. Vascular: No hyperdense vessel or unexpected calcification. Skull: Normal. Negative for fracture or focal lesion. Sinuses/Orbits: No acute finding. Other: None. 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 No acute intracranial abnormality seen. Electronically Signed   By: Marijo Conception M.D.   On: 08/30/2019 12:15   MR BRAIN WO CONTRAST  Result Date: 08/30/2019 CLINICAL DATA:  Acute headache.  Hypertension. EXAM: MRI HEAD WITHOUT CONTRAST TECHNIQUE: Multiplanar, multiecho pulse sequences of the brain and surrounding structures were obtained without intravenous contrast. COMPARISON:  Head CT same day FINDINGS: Brain: Diffusion imaging does not show any acute or subacute infarction. No abnormality affects the brainstem or cerebellum. There is low-density in the left basal ganglia and radiating white matter tracts with a CR aided appearance. Whereas this could be due to old infarction, this could be a cluster dilated perivascular spaces and represent a normal variant. No large vessel territory infarction. Mild  chronic small-vessel ischemic changes elsewhere affecting the cerebral hemispheric deep and subcortical white matter. No mass lesion, hemorrhage, hydrocephalus or extra-axial collection. Vascular: Major vessels at the base of the brain show flow. Skull and upper cervical spine: Negative Sinuses/Orbits: Clear/normal Other: None IMPRESSION: No acute finding.  No evidence of acute or subacute infarction. Chronic small-vessel ischemic changes of the cerebral hemispheric deep and subcortical white matter. Cluster of cystic spaces in the left basal ganglia and radiating white matter tracts favored to represent a normal variant, localized collection of dilated perivascular spaces. Cannot rule out the possibility of that this could relate to old infarction, but dilated perivascular spaces are favored.  Electronically Signed   By: Nelson Chimes M.D.   On: 08/30/2019 14:51   Imaging studies reviewed, reassuring especially with MRI.  Appears to favor mildly dilated perivascular spaces and no evidence to support acute stroke ____________________________________________   PROCEDURES  Procedure(s) performed: None  Procedures  Critical Care performed: No  ____________________________________________   INITIAL IMPRESSION / ASSESSMENT AND PLAN / ED COURSE  Pertinent labs & imaging results that were available during my care of the patient were reviewed by me and considered in my medical decision making (see chart for details).   Patient is for evaluation of hypertension also having mild headache and just slight feeling of lightheadedness or just not feeling quite normal last 4 days.  No associated cardiopulmonary symptoms.  Felt some tingling around her lips.  Because of severe elevation of blood pressure and mild associated headache a head CT was abnormal, the start of possible old infarct, this was followed with an MRI that demonstrates unlikely this would be representative of any stroke.  Patient reports she is amenable to monitoring her blood pressure at home and starting low-dose of hydrochlorothiazide as she had previously discussed possibly starting her blood pressure medicine with her doctor.  She does however report labile blood pressures and history of whitecoat syndrome, but given her presentation and severity of her hypertension I think it is reasonable to start her on low-dose at this time and the patient is in agreement.  Will provide prescription for to utilize should she continue to note elevated blood pressures.  Nurse notified me that the patient blood pressure has risen once again at the time of planned discharge, but the patient did not wish to stay for further treatment citing severe history of white coat hypertension which she had previously told me to  Patient appears  appropriate and worked up without evidence of acute neurologic etiology.  I did discuss with her that since we are in the middle of this pandemic and she is having mild headache some slight feelings of lightheadedness it seems reasonable to screen her for COVID-19 of this ordered a screening test for her prior to discharge        ____________________________________________   FINAL CLINICAL IMPRESSION(S) / ED DIAGNOSES  Final diagnoses:  Hypertension, unspecified type  Dizziness        Note:  This document was prepared using Dragon voice recognition software and may include unintentional dictation errors       Delman Kitten, MD 08/30/19 1646

## 2019-08-30 NOTE — ED Notes (Signed)
Patient transported to MRI 

## 2019-08-30 NOTE — ED Notes (Signed)
ED Provider at bedside. 

## 2019-08-30 NOTE — ED Notes (Signed)
MD Quale aware of pt blood pressure at time of D/C. Pt aware as well and verbalizes that she wishes to go home as "my bp always raises when I come to the hospital and they have to take it". MD Quale aware and okay with pt going home at this time.

## 2019-09-01 LAB — NOVEL CORONAVIRUS, NAA (HOSP ORDER, SEND-OUT TO REF LAB; TAT 18-24 HRS): SARS-CoV-2, NAA: NOT DETECTED

## 2019-09-18 ENCOUNTER — Other Ambulatory Visit: Payer: Self-pay | Admitting: Urology

## 2019-09-18 DIAGNOSIS — N1330 Unspecified hydronephrosis: Secondary | ICD-10-CM

## 2019-09-26 ENCOUNTER — Other Ambulatory Visit: Payer: Self-pay

## 2019-09-26 ENCOUNTER — Ambulatory Visit: Payer: Medicare Other | Attending: Internal Medicine

## 2019-09-26 DIAGNOSIS — Z20822 Contact with and (suspected) exposure to covid-19: Secondary | ICD-10-CM

## 2019-09-27 LAB — NOVEL CORONAVIRUS, NAA: SARS-CoV-2, NAA: NOT DETECTED

## 2019-09-29 ENCOUNTER — Telehealth: Payer: Self-pay | Admitting: Family Medicine

## 2019-09-29 NOTE — Telephone Encounter (Signed)
Spoke to patient and she states she has not run a fever but she has not been drinking much fluids. She is going to see her PCP again next week. She is going to redo the blood work. Patient states she will send the lab results when she has them back.

## 2019-09-29 NOTE — Telephone Encounter (Signed)
Gerrit Friends, CMA  Have there been fevers? Or has she been on any corticosteroids? Also, being dehydrated can cause these abnormalities. Is her PCP planning on repeating her blood work?

## 2019-10-12 NOTE — ED Provider Notes (Signed)
EKG is reviewed entered by me January 2 at 8:10 AM Heart rate 95 QRs 99 QTc 430 Normal sinus rhythm, no evidence of ischemia or ectopy.  Possible LVH   Delman Kitten, MD 10/12/19 [phone]

## 2020-03-22 ENCOUNTER — Ambulatory Visit
Admission: RE | Admit: 2020-03-22 | Discharge: 2020-03-22 | Disposition: A | Discharge status: Home / Self Care | Payer: Medicare Other | Source: Ambulatory Visit | Attending: Urology | Admitting: Urology

## 2020-03-22 ENCOUNTER — Other Ambulatory Visit: Payer: Self-pay

## 2020-03-22 DIAGNOSIS — N1330 Unspecified hydronephrosis: Secondary | ICD-10-CM | POA: Diagnosis present

## 2020-03-23 NOTE — Progress Notes (Signed)
03/24/2020 8:50 AM   Jasmin Walker 04-07-1946 [id number]  Referring provider: Marinda Elk, MD [facility],  [place] [address]  Chief Complaint  Patient presents with  . Results    HPI: 74 year old female with nephrolithiasis, right angiomyolipoma and left adrenal adenoma who presents today for follow up.    Nephrolithiasis CT abdomen pelvis with and without contrast on 10/30/2018 which showed a 9 mm presumed cluster of right UVJ stones with proximal hydroureteronephrosis along with a incidental adrenal adenoma and 2.9 cm right upper pole angiomyolipoma.  Spontaneously passed the right UVJ stones.  Recent CT abdomen pelvis with contrast on 05/22/2019 revealed moderate left hydronephrosis due to a punctate stone in the left ureter at approximately the level of L4.  Mild fullness of the right intrarenal collecting system is improved compared to the prior CT. Cluster of stones at the right UVJ seen on the prior study is no longer present.  Single punctate nonobstructing renal stones.  2.6 cm angiomyolipoma in the upper pole the right kidney, unchanged.  Unchanged left adrenal adenoma.  CTU 07/2019 Fat containing, benign adenoma of the left adrenal gland (series 2, image 20). Redemonstrated macroscopic fat containing angiomyolipoma of the superior pole of the right kidney measuring 2.8 cm (series 9, image 23). There are tiny bilateral nonobstructive calculi of the inferior pole of the right kidney and midportion of the left kidney. There is mild bilateral hydronephrosis; this is unchanged on the right compared to prior examinations and improved on the left, with passage of a previously noted punctuate calculus in the mid left ureter. The bilateral ureters are evenly opacified to the urinary bladder without obstructing lesion or other abnormality noted. Bladder is unremarkable.  RUS 02/2020 Mild right-sided hydronephrosis - improved from previous study.  No  obstructing lesion evident by sonography. Bilateral jets are visualized at the bladder.  Minimal left-sided caliectasis without overt hydronephrosis.  Improved from previous study.  Mild diffuse cortical thinning about the kidneys bilaterally, consistent with atrophy.  2.7 cm echogenic mass at the upper pole the right kidney, consistent with an AML - unchanged from previous study   Right angiomyolipoma Contrast CT 10/2018 2.9 cm right upper pole angiomyolipoma.  Contrast CT 04/2019 2.6 cm angiomyolipoma in the upper pole the right kidney, unchanged.  CTU 07/2019 Redemonstrated macroscopic fat containing angiomyolipoma of the superior pole of the right kidney measuring 2.8 cm   RUS 02/2020 2.7 cm echogenic mass at the upper pole the right kidney, consistent with an AML.  Left adrenal adenoma Contrast CT 10/2018  incidental adrenal adenoma   Contrast CT 04/2019 Unchanged left adrenal adenoma.  CTU 07/2019 Fat containing, benign adenoma of the left adrenal gland.  Follow by endocronologist Dr. Justice Rocher O'Connell [phone].  Last seen 07/2019.  Has follow up appointment 07/2020.    PMH: Past Medical History:  Diagnosis Date  . 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 Asymptomatic microscopic hematuria 10/30/2018  . Fluctuating blood pressure 09/04/2018  . White coat syndrome without diagnosis of hypertension 09/04/2018    Surgical History: Past Surgical History:  Procedure Laterality Date  . APPENDECTOMY  1982  . NISSEN FUNDOPLICATION  84/1660    Home Medications:  Allergies as of 03/24/2020   No Known Allergies     Medication List       Accurate as of March 24, 2020  8:50 AM. If you have any questions, ask your nurse or doctor.  hydrochlorothiazide 12.5 MG tablet Commonly known as: HYDRODIURIL Take 1 tablet (12.5 mg total) by mouth daily.   losartan 25 MG tablet Commonly known as: COZAAR Take 25 mg by mouth daily.   omeprazole 10 MG capsule Commonly known as: PRILOSEC Take  by mouth.   rosuvastatin 10 MG tablet Commonly known as: CRESTOR Take 10 mg by mouth at bedtime.   tamsulosin 0.4 MG Caps capsule Commonly known as: FLOMAX Take 0.4 mg by mouth daily.       Allergies: No Known Allergies  Family History: Family History  Problem Relation Age of Onset  . Cancer Maternal Grandmother        Stomach    Social History:  reports that she has never smoked. She has never used smokeless tobacco. She reports current alcohol use. She reports that she does not use drugs.  ROS: For pertinent review of systems please refer to history of present illness  Physical Exam: BP (!) 154/81 (BP Location: Right Arm, Patient Position: Sitting, Cuff Size: Normal)   Pulse 89   Ht 4\' 11"  (1.499 m)   Wt 178 lb 6.4 oz (80.9 kg)   BMI 36.03 kg/m   Constitutional:  Well nourished. Alert and oriented, No acute distress. HEENT: [place] AT, mask in place.  Trachea midline Cardiovascular: No clubbing, cyanosis, or edema. Respiratory: Normal respiratory effort, no increased work of breathing. Neurologic: Grossly intact, no focal deficits, moving all 4 extremities. Psychiatric: Normal mood and affect.   Laboratory Data: Lab Results  Component Value Date   CREATININE 0.82 08/30/2019   Results for orders placed or performed in visit on 09/26/19  Novel Coronavirus, NAA ([facility])   Specimen: Nasopharyngeal(NP) swabs in vial transport medium   NASOPHARYNGE  TESTING  Result Value Ref Range   SARS-CoV-2, NAA Not Detected Not Detected   Component     Latest Ref Rng & Units 05/27/2019  Specific Gravity, UA     1.005 - 1.030 1.020  pH, UA     5.0 - 7.5 7.0  Color, UA     Yellow Yellow  Appearance Ur     Clear Clear  Leukocytes,UA     Negative Negative  Protein,UA     Negative/Trace Negative  Glucose, UA     Negative Negative  Ketones, UA     Negative Negative  RBC, UA     Negative Trace (A)  Bilirubin, UA     Negative Negative  Urobilinogen, Ur     0.2 - 1.0  mg/dL 0.2  Nitrite, UA     Negative Negative  Microscopic Examination      See below:   Component     Latest Ref Rng & Units 05/27/2019  WBC, UA     0 - 5 /hpf 0-5  RBC     0 - 2 /hpf 0-2  Epithelial Cells (non renal)     0 - 10 /hpf 0-10  Bacteria, UA     None seen/Few None seen  I have reviewed the labs.  Pertinent Imaging: CLINICAL DATA:  Initial evaluation for bilateral hydronephrosis.  EXAM: RENAL / URINARY TRACT ULTRASOUND COMPLETE  COMPARISON:  Prior CT from 08/12/2019.  FINDINGS: Right Kidney:  Renal measurements: 12.3 x 4.5 x 5.7 cm = volume: 165.0 mL. Mild diffuse cortical thinning. Renal echogenicity within normal limits. No nephrolithiasis. Mild right-sided hydronephrosis. No visible obstructing lesion. 2.7 x 2.7 x 2.7 cm echogenic mass at the upper pole, most consistent with an AML.  Left Kidney:  Renal  measurements: 11.7 x 5.2 x 5.6 cm = volume: 178.0 mL. Mild diffuse cortical thinning. Renal echogenicity within normal limits. No nephrolithiasis. Minimal scattered caliectasis without hydronephrosis. No focal renal mass.  Bladder:  Appears normal for degree of bladder distention. Bilateral jets are visualized.  Other:  None.  IMPRESSION: 1. Mild right-sided hydronephrosis. No obstructing lesion evident by sonography. Bilateral jets are visualized at the bladder. 2. Minimal left-sided caliectasis without overt hydronephrosis. 3. Mild diffuse cortical thinning about the kidneys bilaterally, consistent with atrophy. 4. 2.7 cm echogenic mass at the upper pole the right kidney, consistent with an AML.   Electronically Signed   By: Jeannine Boga M.D.   On: 03/23/2020 00:06 I have independently reviewed the films.  See HPI.   Assessment & Plan:    1. Kidney stones Punctate stones not visualized on recent RUS Would need a CT for more detailed imaging, but as patient is asymptomatic will defer Follow clinically  2.  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 Adenoma of left adrenal gland Followed by endocrinology   Return for 02/2022 for RUS.  Zara Council, PA-C  [facility] [address], [place], [place] [address] [phone]

## 2020-03-24 ENCOUNTER — Ambulatory Visit (INDEPENDENT_AMBULATORY_CARE_PROVIDER_SITE_OTHER): Payer: Medicare Other | Admitting: Urology

## 2020-03-24 ENCOUNTER — Encounter: Payer: Self-pay | Admitting: Urology

## 2020-03-24 VITALS — BP 154/81 | HR 89 | Ht 59.0 in | Wt 178.4 lb

## 2020-03-24 DIAGNOSIS — D179 Benign lipomatous neoplasm, unspecified: Secondary | ICD-10-CM

## 2020-03-24 DIAGNOSIS — N200 Calculus of kidney: Secondary | ICD-10-CM

## 2020-03-24 DIAGNOSIS — D3502 Benign neoplasm of left adrenal gland: Secondary | ICD-10-CM | POA: Diagnosis not present

## 2020-11-23 IMAGING — CT CT ABD-PEL WO/W CM
3 of 12 series · 11 of 46 positions shown, 17 images · IV contrast (omnipaque)
Comparison: Renal ultrasound 07/21/2019, CT abdomen pelvis
05/22/2019

CLINICAL DATA: Hydronephrosis

EXAM:
CT ABDOMEN AND PELVIS WITHOUT AND WITH CONTRAST
TECHNIQUE: Multidetector CT imaging of the abdomen and pelvis was performed
following the standard protocol before and following the bolus
administration of intravenous contrast.
CONTRAST:  125mL OMNIPAQUE IOHEXOL 300 MG/ML  SOLN

[Series 5: cor without without pre 2.00 cor · coronal · non-contrast · 0.69mm/px · 2 of 149 slices shown, 3 images]
[im 50/149  soft-tissue]
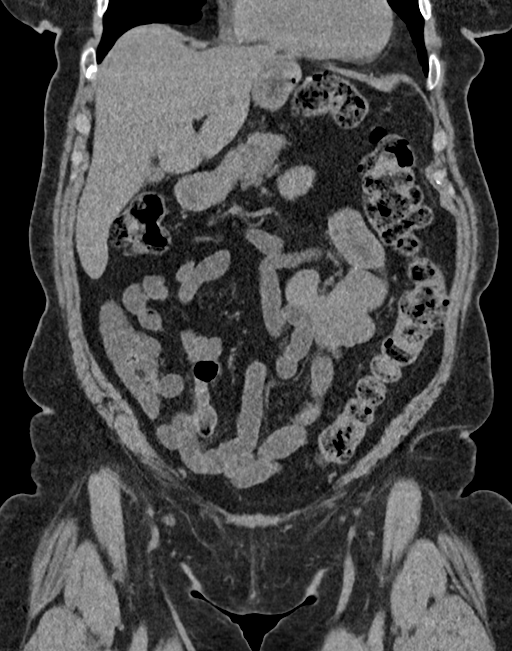
[im 50/149  bone]
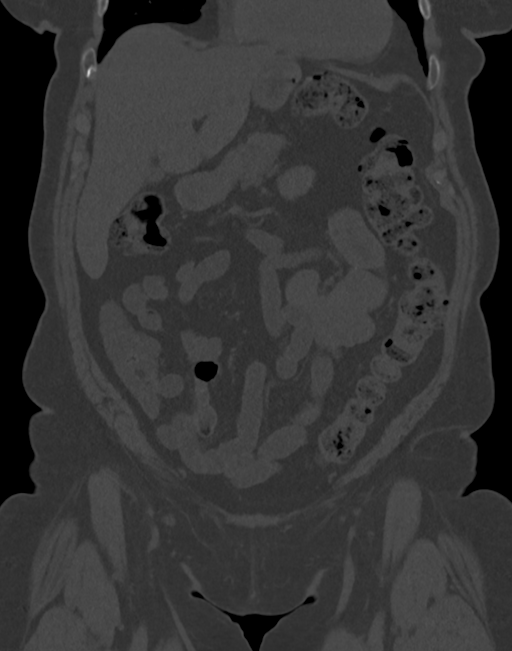
[im 99/149  soft-tissue]
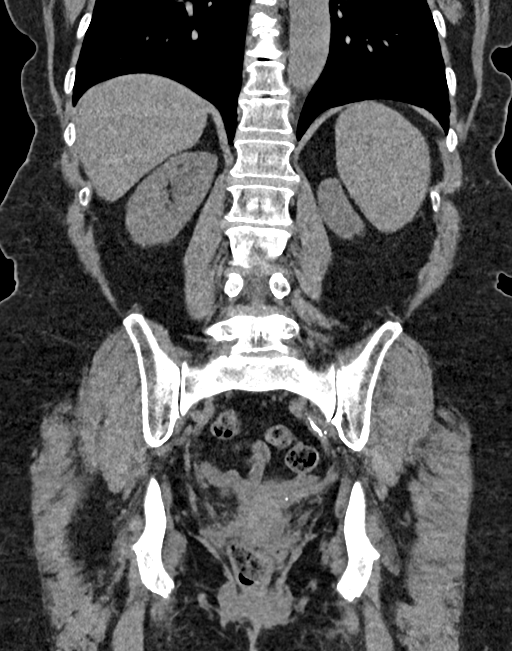

[Series 9: axial with hematuria with 5.00 · axial · 0.69mm/px · z∈[-1435,-1305]mm · 3 of 90 slices shown]
[im 13/90  soft-tissue]
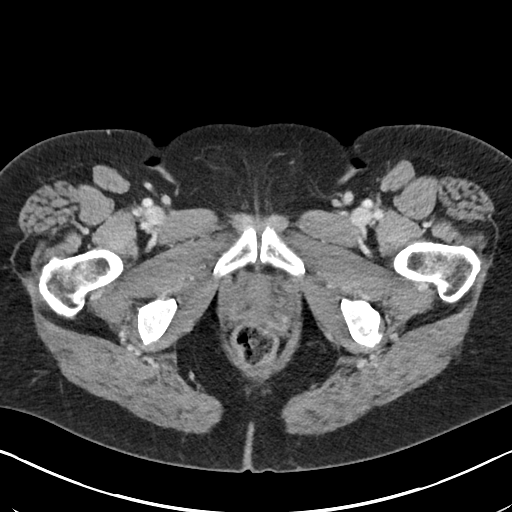
[im 26/90  soft-tissue]
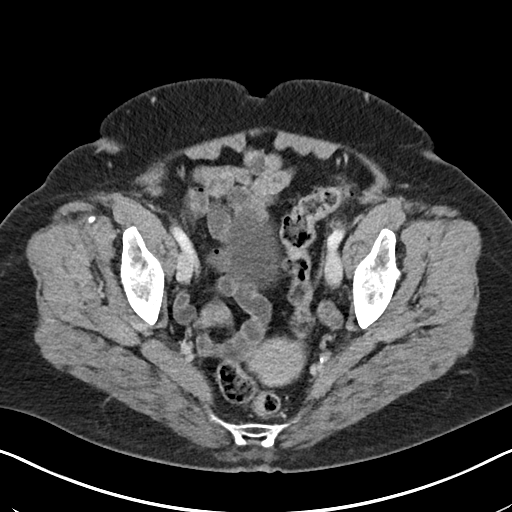
[im 39/90  soft-tissue]
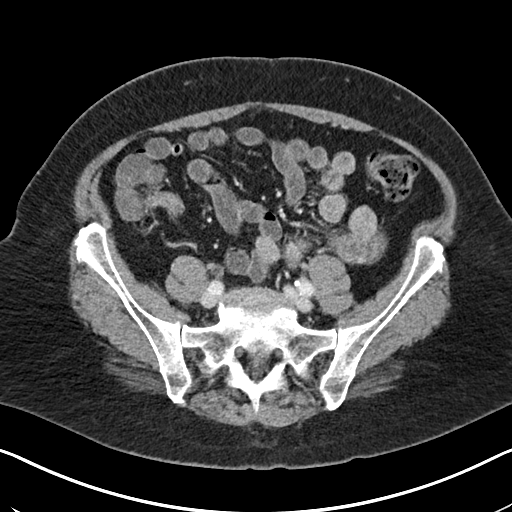

[Series 17: axial delay delay prone 5.00 ax · axial · delayed · 0.69mm/px · z∈[-1342,-1007]mm · 6 of 95 slices shown, 11 images]
[im 14/95  soft-tissue]
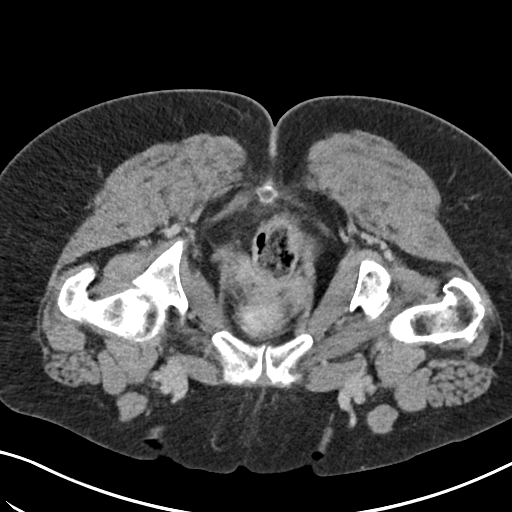
[im 14/95  bone]
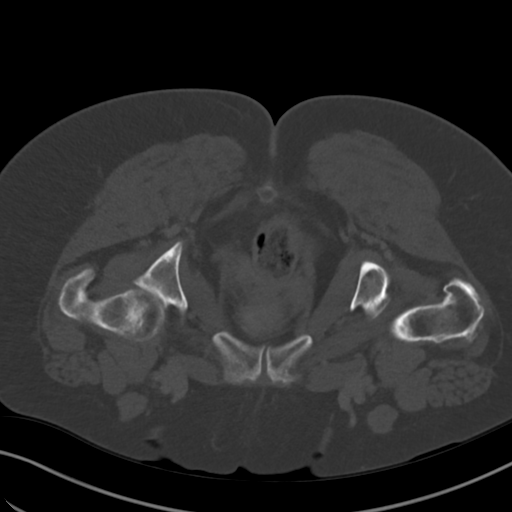
[im 27/95  soft-tissue]
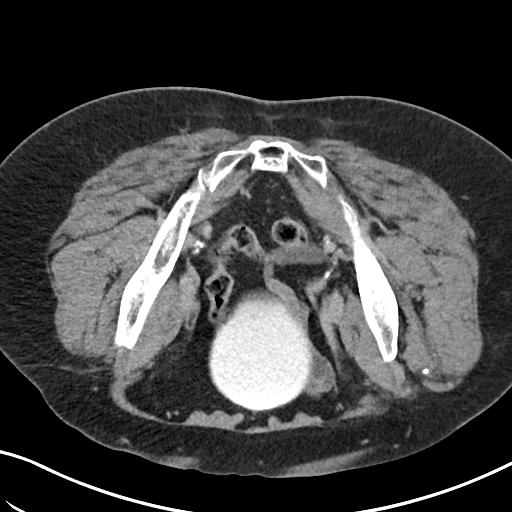
[im 41/95  soft-tissue]
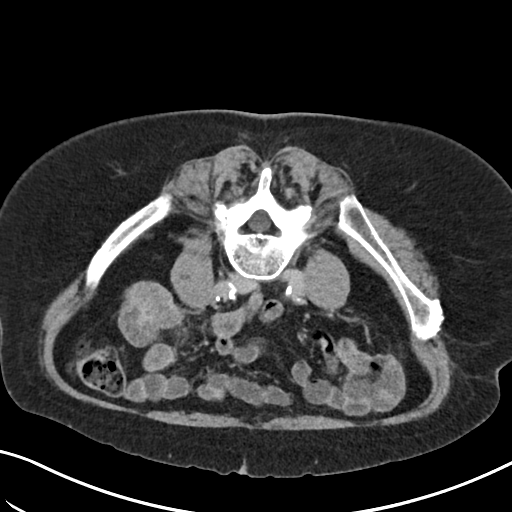
[im 41/95  lung]
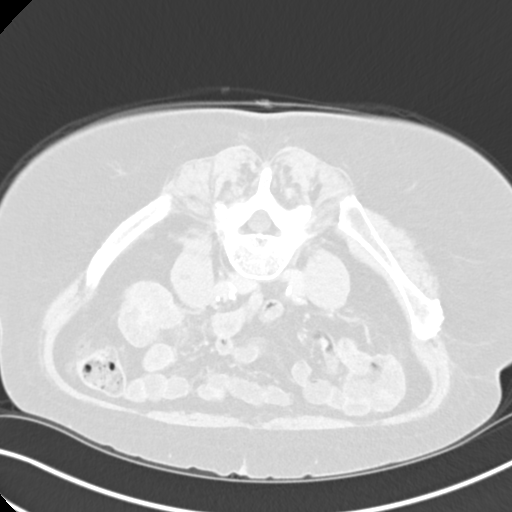
[im 54/95  soft-tissue]
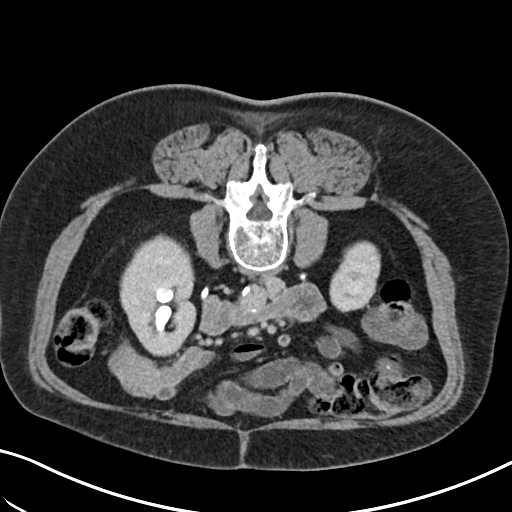
[im 54/95  lung]
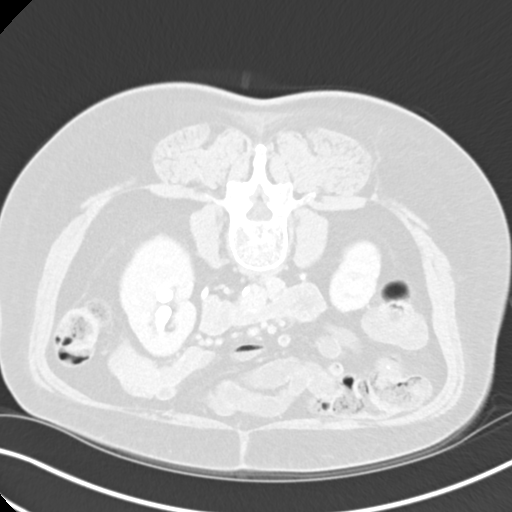
[im 68/95  soft-tissue]
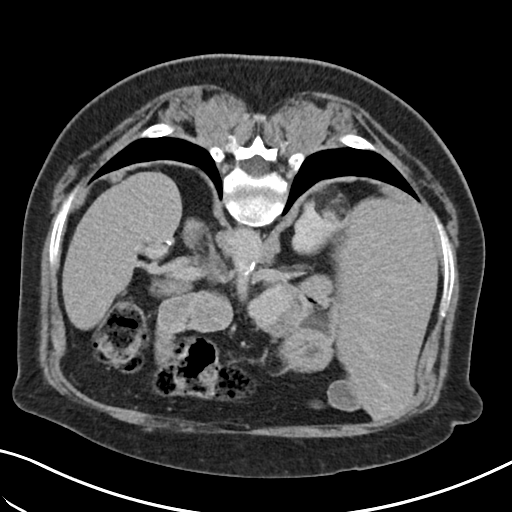
[im 68/95  lung]
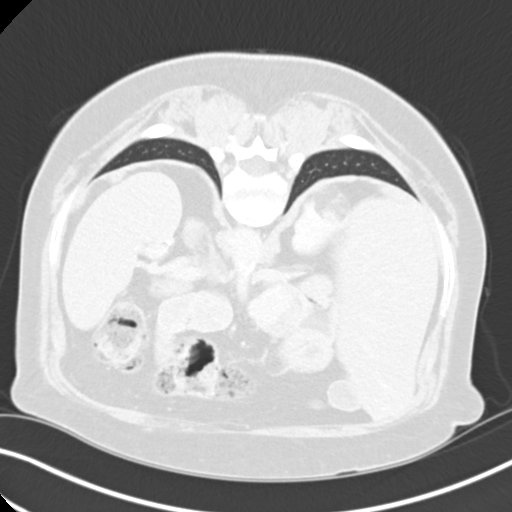
[im 81/95  soft-tissue]
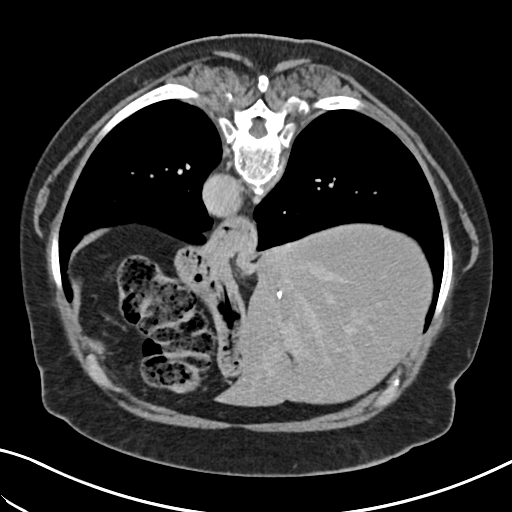
[im 81/95  lung]
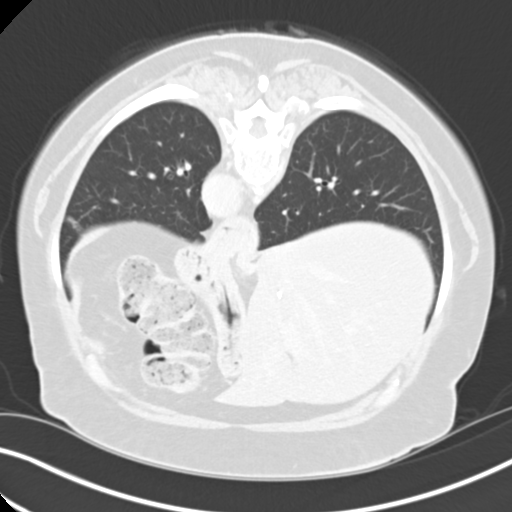

[11 of 46 positions shown; findings below may reference images not displayed]

FINDINGS: Lower chest: No acute abnormality.  Hiatal hernia.

Hepatobiliary: No solid liver abnormality is seen. No gallstones,
gallbladder wall thickening, or biliary dilatation.

Pancreas: Unremarkable. No pancreatic ductal dilatation or
surrounding inflammatory changes.

Spleen: Normal in size without significant abnormality.

Adrenals/Urinary Tract: Fat containing, benign adenoma of the left
adrenal gland (series 2, image 20). Redemonstrated macroscopic fat
containing angiomyolipoma of the superior pole of the right kidney
measuring 2.8 cm (series 9, image 23). There are tiny bilateral
nonobstructive calculi of the inferior pole of the right kidney and
midportion of the left kidney. There is mild bilateral
hydronephrosis; this is unchanged on the right compared to prior
examinations and improved on the left, with passage of a previously
noted punctuate calculus in the mid left ureter. The bilateral
ureters are evenly opacified to the urinary bladder without
obstructing lesion or other abnormality noted. Bladder is
unremarkable.

Stomach/Bowel: Stomach is within normal limits. No evidence of bowel
wall thickening, distention, or inflammatory changes.

Vascular/Lymphatic: Aortic atherosclerosis. No enlarged abdominal or
pelvic lymph nodes.

Reproductive: No mass or other significant abnormality.

Other: Fat containing bilateral inguinal hernias. No abdominopelvic
ascites.

Musculoskeletal: No acute or significant osseous findings.
IMPRESSION: 1. There is mild bilateral hydronephrosis; this is unchanged on the
right compared to prior examinations and improved on the left, with
passage of a previously noted punctuate calculus in the mid left
ureter. The bilateral ureters are evenly opacified to the urinary
bladder without obstructing lesion or other abnormality noted.
2. Bilateral nonobstructive nephrolithiasis.
3. Fat containing angiomyolipoma of the superior pole of the right
kidney.
4. Fat containing, benign left adrenal adenoma.
5. Aortic Atherosclerosis ([id number]).
